# Patient Record
Sex: Female | Born: 1970 | Race: White | Hispanic: No | Marital: Married | Smoking: Never smoker
Health system: Southern US, Community
[De-identification: ages and names within clinical notes are randomized; demographics above are authoritative.]

## PROBLEM LIST (undated history)

## (undated) HISTORY — PX: AUGMENTATION MAMMAPLASTY: SUR837

---

## 2007-04-25 ENCOUNTER — Inpatient Hospital Stay (HOSPITAL_COMMUNITY): Admission: RE | Admit: 2007-04-25 | Discharge: 2007-04-26 | Payer: Self-pay | Admitting: Obstetrics and Gynecology

## 2007-08-31 ENCOUNTER — Ambulatory Visit (HOSPITAL_COMMUNITY): Admission: RE | Admit: 2007-08-31 | Discharge: 2007-08-31 | Payer: Self-pay | Admitting: Obstetrics and Gynecology

## 2008-08-11 ENCOUNTER — Encounter: Admission: RE | Admit: 2008-08-11 | Discharge: 2008-08-11 | Payer: Self-pay | Admitting: Obstetrics and Gynecology

## 2011-03-01 NOTE — H&P (Signed)
Julie Ho, Julie Ho NO.:  [id number]   MEDICAL RECORD NO.:  [id number]          PATIENT TYPE:  AMB   LOCATION:  SDC                           FACILITY:  [facility]   PHYSICIAN:  Huel Cote, M.D. DATE OF BIRTH:  Jul 20, 1971   DATE OF ADMISSION:  08/31/2007  DATE OF DISCHARGE:                              HISTORY & PHYSICAL   PRIORITY PREADMISSION HISTORY AND PHYSICAL   DATE OF SURGERY:  August 31, 2007.   The patient is a G5, P3-0-2-3, who is coming in for a scheduled elective  sterilization procedure with bilateral tubal fulguration.  The patient  desires permanent sterility and has recently had her third baby  approximately three months ago.  Her prenatal care has been uneventful  with her pregnancies, and she is a healthy female.   PAST MEDICAL HISTORY:  1. Allergic asthma.  2. History of ulcer disease.   PAST SURGICAL HISTORY:  In 2005, she had breast augmentation surgery.   PAST OBSTETRICAL HISTORY:  1. Three vaginal deliveries.  2. Two spontaneous miscarriages.   PAST GYNECOLOGIC HISTORY:  She has had no abnormal Pap smears  gynecologically.   She is not allergic to any medicines.   Currently, she is on Depo-Provera for birth control.   We reviewed all options of birth control and definitive permanent  sterilization.  The patient desires to proceed with a permanent  sterilization procedure.  The risks and benefits of tubal fulguration  were discussed with the patient in detail.  We discussed laparoscopy  specifically and the risks of damage to adjacent bowel and bladder with  the need for a larger incision should this occur.  We also discussed a  risk of pregnancy occurrence after tubal ligation of 1 in 100, and the  risk of ectopic pregnancy should this occur.  The patient understands  the risks of bleeding and infection and possible damage to bowel and  bladder as well as the other risks addressed and desires to proceed with  the surgery as  stated.   PHYSICAL EXAMINATION:  VITAL SIGNS:  Her weight is 135, blood pressure  100/78.  CARDIAC EXAM:  Regular rate and rhythm.  ABDOMEN:  Soft and nontender.  LUNGS:  Clear.  PELVIC EXAM:  She has normal external genitalia noted, cervix has no  lesions, uterus is small and anteverted, adnexa have no masses.   Given all options, the patient is electing to proceed with a bilateral  tubal fulguration and will be at the [facility] facility  approximately two hours prior to procedure.      Huel Cote, M.D.  Electronically Signed     KR/MEDQ  D:  08/30/2007  T:  08/30/2007  Job:  [id number]

## 2011-03-01 NOTE — Discharge Summary (Signed)
Julie Ho, URAM NO.:  [id number]   MEDICAL RECORD NO.:  [id number]          PATIENT TYPE:  INP   LOCATION:  9126                          FACILITY:  [facility]   PHYSICIAN:  Huel Cote, M.D. DATE OF BIRTH:  [birth year]   DATE OF ADMISSION:  04/25/2007  DATE OF DISCHARGE:  04/26/2007                               DISCHARGE SUMMARY   DISCHARGE DIAGNOSES:  1. Term pregnancy at 39+ weeks, delivered.  2. Status post normal spontaneous vaginal delivery.   DISCHARGE MEDICATIONS:  1. Motrin 600 mg p.o. every 6 hours.  2. Percocet 1-2 tablets p.o. every 6 hours p.r.n.   DISCHARGE FOLLOW UP:  The patient is to followup in the office in 6  weeks for her postpartum exam.   HOSPITAL COURSE:  The patient is a 40 year old G5, P43 who is admitted  at 39-5/[redacted] weeks gestation for induction of labor given term status and a  favorable cervix.  Prenatal care had been uneventful except for advanced  maternal age with a normal first trimester screen.   Prenatal labs are as follows:  O positive, antibody negative, RPR  nonreactive, rubella immune, hepatitis B surface antigen negative, GC  and Chlamydia negative, group B Strep negative, 1 hour glucose is 77.   PAST OBSTETRICAL HISTORY:  In 1996 she had a 6 pound 8 ounce infant by  forceps.  In 2002 spontaneous miscarriage.  In 2003 spontaneous  miscarriage.  In 2004 she had vaginal delivery of a 7 pound infant.  She  had no abnormal Pap smears.   MEDICAL HISTORY:  Significant only for allergic asthma and a history of  ulcer disease.   PAST SURGICAL HISTORY:  In 2005 she had some breast implants.   ALLERGIES:  None.   MEDICATIONS:  None.   On admission she was afebrile with stable vital signs.  Fetal heart rate  was reactive.  Cervix was 52 and a -2 station.  She had rupture of  membranes performed with clear fluid noted and received an epidural.  Shortly thereafter when she became uncomfortable, she pushed very well  and delivered a normal spontaneous vaginal delivery of a vigorous female  infant over an intact perineum.  Apgars were 8 and 9.  Weight was 7  pounds 4 ounces.  The placenta was delivered spontaneously.  At first  she was going to proceed with a postpartum tubal ligation, however  secondarily  declined and decided to do alternate means of birth control, probably a  vasectomy in her husband.  On postpartum day #1 she was doing quite well  and requested an early discharge.  She was discharged to home with  followup in the office in 6 weeks.      Huel Cote, M.D.  Electronically Signed     KR/MEDQ  D:  06/22/2007  T:  06/22/2007  Job:  [id number]

## 2011-03-01 NOTE — Op Note (Signed)
Julie Ho, PLETZ NO.:  [id number]   MEDICAL RECORD NO.:  [id number]          PATIENT TYPE:  AMB   LOCATION:  SDC                           FACILITY:  [facility]   PHYSICIAN:  Huel Cote, M.D. DATE OF BIRTH:  Oct 15, 1971   DATE OF PROCEDURE:  08/31/2007  DATE OF DISCHARGE:                               OPERATIVE REPORT   PREOPERATIVE DIAGNOSIS:  Desires sterility.   POSTOPERATIVE DIAGNOSIS:  Desires sterility.   PROCEDURE:  Laparoscopic bilateral tubal fulguration.   SURGEON:  Huel Cote, MD   ASSISTANT:  None.   ANESTHESIA:  General.   FINDINGS:  There is normal pelvic and abdominal anatomy noted.   SPECIMEN:  None.   ESTIMATED BLOOD LOSS:  50 mL.   URINE OUTPUT:  50 mL, straight catheterization prior to procedure of  clear urine.   INTRAVENOUS FLUIDS:  2100 mL LR.   PROCEDURE:  The patient was taken to the operating room, where general  anesthesia was obtained without difficulty.  She was then prepped and  draped in the normal sterile fashion in the dorsal lithotomy position.  A speculum was placed in the patient's vagina and the cervix identified  and a Hulka tenaculum placed within it for uterine manipulation.  The  bladder was emptied and attention was then turned to the patient's  abdomen.  A small infraumbilical incision was then made after injection  with 0.25% Marcaine and the Veress needle introduced into the peritoneal  cavity; this was confirmed with both aspiration and injection with  normal saline.  The 10/11 trocar was then placed into the incision and  entered into the cavity without difficulty.  The operating scope was  then introduced and through the trocar and the pelvis and abdomen  inspected.  The uterus and ovaries appeared completely within normal  limits.  The appendix was normal on the right and the liver edge and  gallbladder appeared grossly normal.  At this point, the tubes were  clearly visible traced out to  their fimbriated ends without difficulty.  There were then grasped approximately 3 cm from the cornu and a 2- to 3-  cm segment of tube was fulgurated with the Kleppinger cautery with good  blanching noted after several burns; this was performed bilaterally.  At  the conclusion of the procedure, there was a 2- to 3-cm segment of tube  which was completely blanched and had been burned several times at each  location.  There was no active bleeding noted and the pelvis and abdomen  appeared normal; therefore, the camera was removed from the trocar, the  pneumoperitoneum reduced through the trocar and the trocar was removed  without difficulty.  The umbilical incision was then closed with 1 deep  layer of zero Vicryl in an interrupted suture and then an additional  subcuticular stitch of 4-0 Vicryl to close the skin.  Sponge, lap and  needle  counts were correct x2 and the patient was taken to the recovery room in  stable condition after the Hulka tenaculum was removed.   OPERATIVE REPORT:  , we [place] thank  you      Huel Cote, M.D.  Electronically Signed     KR/MEDQ  D:  08/31/2007  T:  09/01/2007  Job:  [id number]

## 2011-07-26 LAB — CBC
RBC: 4.3
WBC: 5

## 2011-08-02 LAB — CBC
MCHC: 34.1
MCHC: 34.3
Platelets: 223
RDW: 12.9
RDW: 13.1
WBC: 11.1 — ABNORMAL HIGH
WBC: 9.4

## 2014-04-07 ENCOUNTER — Other Ambulatory Visit: Payer: Self-pay | Admitting: Obstetrics and Gynecology

## 2014-04-07 DIAGNOSIS — R928 Other abnormal and inconclusive findings on diagnostic imaging of breast: Secondary | ICD-10-CM

## 2014-04-14 ENCOUNTER — Ambulatory Visit
Admission: RE | Admit: 2014-04-14 | Discharge: 2014-04-14 | Disposition: A | Discharge status: Home / Self Care | Payer: BC Managed Care – PPO | Source: Ambulatory Visit | Attending: Obstetrics and Gynecology | Admitting: Obstetrics and Gynecology

## 2014-04-14 DIAGNOSIS — R928 Other abnormal and inconclusive findings on diagnostic imaging of breast: Secondary | ICD-10-CM

## 2017-02-12 ENCOUNTER — Ambulatory Visit (HOSPITAL_COMMUNITY)
Admission: EM | Admit: 2017-02-12 | Discharge: 2017-02-12 | Disposition: A | Discharge status: Home / Self Care | Payer: BC Managed Care – PPO | Attending: Internal Medicine | Admitting: Internal Medicine

## 2017-02-12 ENCOUNTER — Encounter (HOSPITAL_COMMUNITY): Payer: Self-pay | Admitting: *Deleted

## 2017-02-12 DIAGNOSIS — J019 Acute sinusitis, unspecified: Secondary | ICD-10-CM

## 2017-02-12 MED ORDER — CETIRIZINE-PSEUDOEPHEDRINE ER 5-120 MG PO TB12
1.0000 | ORAL_TABLET | Freq: Every day | ORAL | 0 refills | Status: DC
Start: 2017-02-12 — End: 2017-03-01

## 2017-02-12 MED ORDER — FLUTICASONE PROPIONATE 50 MCG/ACT NA SUSP: 2.0000 | Freq: Every day | NASAL | 2 refills | Status: DC

## 2017-02-12 MED ORDER — AMOXICILLIN-POT CLAVULANATE 875-125 MG PO TABS: 1.0000 | ORAL_TABLET | Freq: Two times a day (BID) | ORAL | 0 refills | Status: DC

## 2017-02-12 NOTE — ED Triage Notes (Signed)
Patient reports nasal congestion/drainage, headache, fever, and loss of voice.

## 2017-02-12 NOTE — Discharge Instructions (Signed)
A neti pot is a container designed to rinse debris or mucus from your nasal cavity. You might use a neti pot to treat symptoms of nasal allergies, sinus problems or colds. °If you choose to make your own saltwater solution, it's important to use bottled water that has been distilled or sterilized. Tap water is acceptable if it's been boiled for several minutes and then left to cool until it is lukewarm. °To use the neti pot, tilt your head sideways over the sink and place the spout of the neti pot in the upper nostril. Breathing through your open mouth, gently pour the saltwater solution into your upper nostril so that the liquid drains through the lower nostril. Repeat on the other side. °Be sure to rinse the irrigation device after each use with similarly distilled, sterile, previously boiled and cooled, or filtered water and leave open to air dry. °Neti pots are often available in pharmacies and health food stores, as well online.  ° °

## 2017-02-12 NOTE — ED Provider Notes (Signed)
CSN: [id number]     Arrival date & time 02/12/17  1205 History   None    Chief Complaint  Patient presents with  . Headache  . Nasal Congestion  . Fever   (Consider location/radiation/quality/duration/timing/severity/associated sxs/prior Treatment)  HPI   Patient is a 46 year old female who reports a biannual history of sinus infections and nasal congestion.  Patient's last antibiotic was less than 6 months ago. Patient states she was fine until this past Friday when she started with a scratchy sore throat pounding headache and a fever. Patient was a productive cough with yellow sputum.   History reviewed. No pertinent past medical history. History reviewed. No pertinent surgical history. History reviewed. No pertinent family history. Social History  Substance Use Topics  . Smoking status: Never Smoker  . Smokeless tobacco: Never Used  . Alcohol use No   OB History    No data available     Review of Systems  Constitutional: Positive for fatigue and fever.  HENT: Negative.   Eyes: Positive for itching. Negative for visual disturbance.  Respiratory: Negative.  Negative for cough and shortness of breath.   Cardiovascular: Negative.  Negative for chest pain and leg swelling.  Gastrointestinal: Negative.   Endocrine: Negative.   Genitourinary: Negative.   Musculoskeletal: Negative.  Negative for gait problem and neck stiffness.  Skin: Negative.   Allergic/Immunologic: Negative.   Neurological: Negative.  Negative for dizziness and headaches.  Hematological: Negative.   Psychiatric/Behavioral: Negative.     Allergies  Aspirin  Home Medications   Prior to Admission medications   Medication Sig Start Date End Date Taking? Authorizing Provider  amoxicillin-clavulanate (AUGMENTIN) 875-125 MG tablet Take 1 tablet by mouth every 12 (twelve) hours. 02/12/17   Servando Salina, NP  cetirizine-pseudoephedrine (ZYRTEC-D) 5-120 MG tablet Take 1 tablet by mouth daily. 02/12/17    Servando Salina, NP  fluticasone (FLONASE) 50 MCG/ACT nasal spray Place 2 sprays into both nostrils daily. 02/12/17   Servando Salina, NP   Meds Ordered and Administered this Visit  Medications - No data to display  BP 131/75   Pulse 70   Temp 98.3 F (36.8 C) (Oral)   Resp 17   SpO2 100%  No data found.   Physical Exam  Constitutional: She is oriented to person, place, and time. She appears well-developed and well-nourished. No distress.  HENT:  Head: Normocephalic and atraumatic.  Right Ear: External ear normal.  Left Ear: External ear normal.  Mouth/Throat: Oropharyngeal exudate present.  Bilateral nares patent but swollen and boggy in appearance.  Eyes: Pupils are equal, round, and reactive to light. Right eye exhibits no discharge. Left eye exhibits no discharge. No scleral icterus.  Neck: Normal range of motion. Neck supple. No tracheal deviation present.  Negative nuchal rigidity. Patient has mild left anterior cervical lymphadenopathy present.  Cardiovascular: Normal rate, regular rhythm, normal heart sounds and intact distal pulses.  Exam reveals no gallop and no friction rub.   No murmur heard. Pulmonary/Chest: Effort normal and breath sounds normal. No stridor. No respiratory distress. She has no wheezes. She has no rales. She exhibits no tenderness.  Lymphadenopathy:    She has cervical adenopathy.  Neurological: She is alert and oriented to person, place, and time.  Skin: Skin is warm and dry. No rash noted. She is not diaphoretic. No erythema. No pallor.  Nursing note and vitals reviewed.   Urgent Care Course     Procedures (including critical care time)  Labs Review  Labs Reviewed - No data to display  Imaging Review No results found.    MDM   1. Acute sinusitis, recurrence not specified, unspecified location    Meds ordered this encounter  Medications  . amoxicillin-clavulanate (AUGMENTIN) 875-125 MG tablet    Sig: Take 1 tablet by mouth every  12 (twelve) hours.    Dispense:  14 tablet    Refill:  0  . fluticasone (FLONASE) 50 MCG/ACT nasal spray    Sig: Place 2 sprays into both nostrils daily.    Dispense:  16 g    Refill:  2  . cetirizine-pseudoephedrine (ZYRTEC-D) 5-120 MG tablet    Sig: Take 1 tablet by mouth daily.    Dispense:  15 tablet    Refill:  0   The usual and customary discharge instructions and warnings were given.  The patient verbalizes understanding and agrees to plan of care.        Servando Salina, NP 02/12/17 [phone]

## 2017-03-01 ENCOUNTER — Encounter (HOSPITAL_COMMUNITY): Payer: Self-pay | Admitting: Family Medicine

## 2017-03-01 ENCOUNTER — Ambulatory Visit (HOSPITAL_COMMUNITY)
Admission: EM | Admit: 2017-03-01 | Discharge: 2017-03-01 | Disposition: A | Discharge status: Home / Self Care | Payer: BC Managed Care – PPO | Attending: Family Medicine | Admitting: Family Medicine

## 2017-03-01 DIAGNOSIS — J309 Allergic rhinitis, unspecified: Secondary | ICD-10-CM | POA: Diagnosis not present

## 2017-03-01 DIAGNOSIS — R0982 Postnasal drip: Secondary | ICD-10-CM | POA: Diagnosis not present

## 2017-03-01 DIAGNOSIS — J9801 Acute bronchospasm: Secondary | ICD-10-CM | POA: Diagnosis not present

## 2017-03-01 MED ORDER — PREDNISONE 20 MG PO TABS: ORAL_TABLET | ORAL | 0 refills | Status: DC

## 2017-03-01 MED ORDER — ALBUTEROL SULFATE HFA 108 (90 BASE) MCG/ACT IN AERS: 2.0000 | INHALATION_SPRAY | RESPIRATORY_TRACT | 0 refills | Status: AC | PRN

## 2017-03-01 NOTE — ED Provider Notes (Signed)
CSN: [id number]     Arrival date & time 03/01/17  1523 History   First MD Initiated Contact with Patient 03/01/17 1647     Chief Complaint  Patient presents with  . Cough  . Shortness of Breath   (Consider location/radiation/quality/duration/timing/severity/associated sxs/prior Treatment) 46 year old female complaining of sinus congestion. She states since her last visit here 2 weeks ago and being treated with Augmentin her fever is gone and she still has congestion and at that time started with a cough and shortness of breath. She used to have asthma and used an inhaler at that time but not recently. She also complains of PND. She is not taking any medicines for congestion but she is using fluticasone nasal spray.      History reviewed. No pertinent past medical history. History reviewed. No pertinent surgical history. History reviewed. No pertinent family history. Social History  Substance Use Topics  . Smoking status: Never Smoker  . Smokeless tobacco: Never Used  . Alcohol use No   OB History    No data available     Review of Systems  Constitutional: Negative for activity change, appetite change, chills, fatigue and fever.  HENT: Positive for congestion, postnasal drip and sinus pressure. Negative for facial swelling and rhinorrhea.   Eyes: Negative.   Respiratory: Positive for cough, shortness of breath and wheezing.   Cardiovascular: Negative.   Gastrointestinal: Negative.   Musculoskeletal: Negative for neck pain and neck stiffness.  Skin: Negative for pallor and rash.  Neurological: Negative.   All other systems reviewed and are negative.   Allergies  Aspirin  Home Medications   Prior to Admission medications   Medication Sig Start Date End Date Taking? Authorizing Provider  albuterol (PROVENTIL HFA;VENTOLIN HFA) 108 (90 Base) MCG/ACT inhaler Inhale 2 puffs into the lungs every 4 (four) hours as needed for wheezing or shortness of breath. 03/01/17   Hayden RasmussenMabe,  Sally-Ann Cutbirth, NP  fluticasone (FLONASE) 50 MCG/ACT nasal spray Place 2 sprays into both nostrils daily. 02/12/17   Servando Salinaossi, Catherine H, NP  predniSONE (DELTASONE) 20 MG tablet 3 Tabs PO Days 1-3, then 2 tabs PO Days 4-6, then 1 tab PO Day 7-9, then Half Tab PO Day 10-12. Take with food. 03/01/17   Hayden RasmussenMabe, Raigan Baria, NP   Meds Ordered and Administered this Visit  Medications - No data to display  BP 118/81   Pulse 75   Temp 98 F (36.7 C) (Oral)   Resp 18   SpO2 100%  No data found.   Physical Exam  Constitutional: She is oriented to person, place, and time. She appears well-developed and well-nourished. No distress.  HENT:  Mouth/Throat: No oropharyngeal exudate.  Bilateral TMs with mild retraction.. No effusion or erythema. Oropharynx pink with minor injection and clear PND.  Eyes: EOM are normal.  Neck: Normal range of motion. Neck supple.  Cardiovascular: Normal rate, regular rhythm, normal heart sounds and intact distal pulses.   Pulmonary/Chest: Effort normal. No respiratory distress. She has wheezes.  Musculoskeletal: Normal range of motion. She exhibits no edema.  Lymphadenopathy:    She has no cervical adenopathy.  Neurological: She is alert and oriented to person, place, and time.  Skin: Skin is warm and dry. No rash noted.  Psychiatric: She has a normal mood and affect.  Nursing note and vitals reviewed.   Urgent Care Course     Procedures (including critical care time)  Labs Review Labs Reviewed - No data to display  Imaging Review No results found.  Visual Acuity Review  Right Eye Distance:   Left Eye Distance:   Bilateral Distance:    Right Eye Near:   Left Eye Near:    Bilateral Near:         MDM   1. Allergic sinusitis   2. PND (post-nasal drip)   3. Cough due to bronchospasm    Sudafed PE 10 mg every 4 to 6 hours as needed for congestion Allegra or Zyrtec daily as needed for drainage and runny nose. For stronger antihistamine may take  Chlor-Trimeton 2 to 4 mg every 4 to 6 hours, may cause drowsiness. Saline nasal spray used frequently. Ibuprofen 600 mg every 6 hours as needed for pain, discomfort or fever. Drink plenty of fluids and stay well-hydrated. Flonase or Rhinocort nasal spray daily Meds ordered this encounter  Medications  . albuterol (PROVENTIL HFA;VENTOLIN HFA) 108 (90 Base) MCG/ACT inhaler    Sig: Inhale 2 puffs into the lungs every 4 (four) hours as needed for wheezing or shortness of breath.    Dispense:  1 Inhaler    Refill:  0    Order Specific Question:   Supervising Provider    Answer:   Elvina Sidle [[id number]]  . predniSONE (DELTASONE) 20 MG tablet    Sig: 3 Tabs PO Days 1-3, then 2 tabs PO Days 4-6, then 1 tab PO Day 7-9, then Half Tab PO Day 10-12. Take with food.    Dispense:  20 tablet    Refill:  0    Order Specific Question:   Supervising Provider    Answer:   Elvina Sidle [[id number]]       Hayden Rasmussen, NP 03/01/17 1711

## 2017-03-01 NOTE — ED Triage Notes (Signed)
Pt here for cough, SOB. sts that she has been sick for a few weeks and was seen here last week. sts she has finished taking all her meds and not better.

## 2017-03-01 NOTE — Discharge Instructions (Signed)
Sudafed PE 10 mg every 4 to 6 hours as needed for congestion °Allegra or Zyrtec daily as needed for drainage and runny nose. °For stronger antihistamine may take Chlor-Trimeton 2 to 4 mg every 4 to 6 hours, may cause drowsiness. °Saline nasal spray used frequently. °Ibuprofen 600 mg every 6 hours as needed for pain, discomfort or fever. °Drink plenty of fluids and stay well-hydrated. °Flonase or Rhinocort nasal spray daily °

## 2018-03-08 ENCOUNTER — Other Ambulatory Visit: Payer: Self-pay

## 2018-03-08 ENCOUNTER — Ambulatory Visit (HOSPITAL_COMMUNITY)
Admission: EM | Admit: 2018-03-08 | Discharge: 2018-03-08 | Disposition: A | Discharge status: Home / Self Care | Payer: BC Managed Care – PPO | Attending: Family Medicine | Admitting: Family Medicine

## 2018-03-08 ENCOUNTER — Encounter (HOSPITAL_COMMUNITY): Payer: Self-pay | Admitting: Emergency Medicine

## 2018-03-08 DIAGNOSIS — J069 Acute upper respiratory infection, unspecified: Secondary | ICD-10-CM | POA: Diagnosis not present

## 2018-03-08 DIAGNOSIS — B9789 Other viral agents as the cause of diseases classified elsewhere: Secondary | ICD-10-CM | POA: Diagnosis not present

## 2018-03-08 MED ORDER — CETIRIZINE HCL 10 MG PO CAPS: 10.0000 mg | ORAL_CAPSULE | Freq: Every day | ORAL | 0 refills | Status: AC

## 2018-03-08 MED ORDER — BENZONATATE 200 MG PO CAPS: 200.0000 mg | ORAL_CAPSULE | Freq: Three times a day (TID) | ORAL | 0 refills | Status: AC | PRN

## 2018-03-08 MED ORDER — IBUPROFEN 600 MG PO TABS: 600.0000 mg | ORAL_TABLET | Freq: Four times a day (QID) | ORAL | 0 refills | Status: AC | PRN

## 2018-03-08 MED ORDER — FLUTICASONE PROPIONATE 50 MCG/ACT NA SUSP: 2.0000 | Freq: Every day | NASAL | 2 refills | Status: DC

## 2018-03-08 NOTE — Discharge Instructions (Signed)
Begin flonase, continue zyrtec; may also try afrin for 3 days  Tessalon for cough  Use anti-inflammatories for headache/fever. You may take up to 800 mg Ibuprofen every 8 hours with food. You may supplement Ibuprofen with Tylenol [phone] mg every 8 hours.

## 2018-03-08 NOTE — ED Provider Notes (Signed)
[facility]    CSN: [id number] Arrival date & time: 03/08/18  1013     History   Chief Complaint Chief Complaint  Patient presents with  . URI    HPI Julie Ho is a 47 y.o. female no contributing past medical history, Patient is presenting with URI symptoms- congestion, cough, mild sore throat.  So endorsing headache and hoarseness.  Patient's main complaints are congestion. Symptoms have been going on for 2 days. Patient has tried Tylenol, taking Zyrtec for 3 days, with minimal relief. Denies fever, nausea, vomiting, diarrhea. Denies shortness of breath and chest pain.  Denies history of asthma and smoking.   HPI  History reviewed. No pertinent past medical history.  There are no active problems to display for this patient.   History reviewed. No pertinent surgical history.  OB History   None      Home Medications    Prior to Admission medications   Medication Sig Start Date End Date Taking? Authorizing Provider  acetaminophen (TYLENOL) 325 MG tablet Take 650 mg by mouth every 6 (six) hours as needed.   Yes [provider]  albuterol (PROVENTIL HFA;VENTOLIN HFA) 108 (90 Base) MCG/ACT inhaler Inhale 2 puffs into the lungs every 4 (four) hours as needed for wheezing or shortness of breath. 03/01/17   Hayden Rasmussen, NP  benzonatate (TESSALON) 200 MG capsule Take 1 capsule (200 mg total) by mouth 3 (three) times daily as needed for up to 7 days for cough. 03/08/18 03/15/18  Kloi Brodman C, PA-C  Cetirizine HCl 10 MG CAPS Take 1 capsule (10 mg total) by mouth daily for 15 days. 03/08/18 03/23/18  Payden Docter C, PA-C  fluticasone (FLONASE) 50 MCG/ACT nasal spray Place 2 sprays into both nostrils daily. 03/08/18   Chris Narasimhan C, PA-C  ibuprofen (ADVIL,MOTRIN) 600 MG tablet Take 1 tablet (600 mg total) by mouth every 6 (six) hours as needed. 03/08/18   Selassie Spatafore, Junius Creamer, PA-C    Family History Family History  Problem Relation Age of Onset  .  Hypertension Mother   . Hypertension Father     Social History Social History   Tobacco Use  . Smoking status: Never Smoker  . Smokeless tobacco: Never Used  Substance Use Topics  . Alcohol use: No  . Drug use: Never     Allergies   Aspirin   Review of Systems Review of Systems  Constitutional: Negative for chills, fatigue and fever.  HENT: Positive for congestion, rhinorrhea, sinus pressure, sore throat and voice change. Negative for ear pain and trouble swallowing.   Respiratory: Positive for cough. Negative for chest tightness and shortness of breath.   Cardiovascular: Negative for chest pain.  Gastrointestinal: Negative for abdominal pain, nausea and vomiting.  Musculoskeletal: Negative for myalgias.  Skin: Negative for rash.  Neurological: Negative for dizziness, light-headedness and headaches.     Physical Exam Triage Vital Signs ED Triage Vitals  Enc Vitals Group     BP 03/08/18 1047 122/70     Pulse Rate 03/08/18 1047 68     Resp 03/08/18 1047 18     Temp 03/08/18 1047 98 F (36.7 C)     Temp Source 03/08/18 1047 Oral     SpO2 03/08/18 1047 100 %     Weight --      Height --      Head Circumference --      Peak Flow --      Pain Score 03/08/18 1044 2  Pain Loc --      Pain Edu? --      Excl. in GC? --    No data found.  Updated Vital Signs BP 122/70 (BP Location: Left Arm)   Pulse 68   Temp 98 F (36.7 C) (Oral)   Resp 18   LMP 03/01/2018   SpO2 100%   Visual Acuity Right Eye Distance:   Left Eye Distance:   Bilateral Distance:    Right Eye Near:   Left Eye Near:    Bilateral Near:     Physical Exam  Constitutional: She appears well-developed and well-nourished. No distress.  HENT:  Head: Normocephalic and atraumatic.  Bilateral ears without tenderness to palpation of external auricle, tragus and mastoid, EAC's without erythema or swelling, TM's with good bony landmarks and cone of light. Non erythematous.  Oral mucosa pink and  moist, no tonsillar enlargement or exudate. Posterior pharynx patent and erythematous, no uvula deviation or swelling. Normal phonation.   Eyes: Conjunctivae are normal.  Neck: Neck supple.  Cardiovascular: Normal rate and regular rhythm.  No murmur heard. Pulmonary/Chest: Effort normal and breath sounds normal. No respiratory distress.  Breathing comfortably at rest, CTABL, no wheezing, rales or other adventitious sounds auscultated  Abdominal: Soft. There is no tenderness.  Musculoskeletal: She exhibits no edema.  Neurological: She is alert.  Skin: Skin is warm and dry.  Psychiatric: She has a normal mood and affect.  Nursing note and vitals reviewed.    UC Treatments / Results  Labs (all labs ordered are listed, but only abnormal results are displayed) Labs Reviewed - No data to display  EKG None  Radiology No results found.  Procedures Procedures (including critical care time)  Medications Ordered in UC Medications - No data to display  Initial Impression / Assessment and Plan / UC Course  I have reviewed the triage vital signs and the nursing notes.  Pertinent labs & imaging results that were available during my care of the patient were reviewed by me and considered in my medical decision making (see chart for details).     Patient with URI symptoms, likely viral etiology.  Vital signs stable.  Will recommend symptomatic management; recommendations below.  Also advised honey/lemon in hot tea for hoarseness.  Expect gradual resolution over the next week.Discussed strict return precautions. Patient verbalized understanding and is agreeable with plan.  Final Clinical Impressions(s) / UC Diagnoses   Final diagnoses:  Viral URI with cough     Discharge Instructions     Begin flonase, continue zyrtec; may also try afrin for 3 days  Tessalon for cough  Use anti-inflammatories for headache/fever. You may take up to 800 mg Ibuprofen every 8 hours with food. You may  supplement Ibuprofen with Tylenol [phone] mg every 8 hours.     ED Prescriptions    Medication Sig Dispense Auth. Provider   fluticasone (FLONASE) 50 MCG/ACT nasal spray Place 2 sprays into both nostrils daily. 16 g Naelle Diegel C, PA-C   Cetirizine HCl 10 MG CAPS Take 1 capsule (10 mg total) by mouth daily for 15 days. 15 capsule Dearia Wilmouth C, PA-C   benzonatate (TESSALON) 200 MG capsule Take 1 capsule (200 mg total) by mouth 3 (three) times daily as needed for up to 7 days for cough. 28 capsule Essica Kiker C, PA-C   ibuprofen (ADVIL,MOTRIN) 600 MG tablet Take 1 tablet (600 mg total) by mouth every 6 (six) hours as needed. 30 tablet Albaro Deviney, [place] C, PA-C  Controlled Substance Prescriptions [place] Controlled Substance Registry consulted? Not Applicable   Lew Dawes, [place] 03/08/18 1131

## 2018-03-08 NOTE — ED Triage Notes (Signed)
Onset 2 days ago of not feeling well.  Having headaches and cough, sinus drainage.  Voice is intermittently audible.

## 2018-08-07 ENCOUNTER — Other Ambulatory Visit: Payer: Self-pay | Admitting: Obstetrics and Gynecology

## 2018-08-07 DIAGNOSIS — R928 Other abnormal and inconclusive findings on diagnostic imaging of breast: Secondary | ICD-10-CM

## 2018-08-13 ENCOUNTER — Ambulatory Visit
Admission: RE | Admit: 2018-08-13 | Discharge: 2018-08-13 | Disposition: A | Discharge status: Home / Self Care | Payer: BC Managed Care – PPO | Source: Ambulatory Visit | Attending: Obstetrics and Gynecology | Admitting: Obstetrics and Gynecology

## 2018-08-13 DIAGNOSIS — R928 Other abnormal and inconclusive findings on diagnostic imaging of breast: Secondary | ICD-10-CM

## 2018-12-23 ENCOUNTER — Ambulatory Visit (HOSPITAL_COMMUNITY)
Admission: EM | Admit: 2018-12-23 | Discharge: 2018-12-23 | Disposition: A | Discharge status: Home / Self Care | Payer: BC Managed Care – PPO | Attending: Family Medicine | Admitting: Family Medicine

## 2018-12-23 ENCOUNTER — Encounter (HOSPITAL_COMMUNITY): Payer: Self-pay | Admitting: Emergency Medicine

## 2018-12-23 DIAGNOSIS — J019 Acute sinusitis, unspecified: Secondary | ICD-10-CM

## 2018-12-23 MED ORDER — AMOXICILLIN-POT CLAVULANATE 875-125 MG PO TABS: 1.0000 | ORAL_TABLET | Freq: Two times a day (BID) | ORAL | 0 refills | Status: AC

## 2018-12-23 MED ORDER — FLUTICASONE PROPIONATE 50 MCG/ACT NA SUSP: 1.0000 | Freq: Every day | NASAL | 0 refills | Status: AC

## 2018-12-23 NOTE — ED Triage Notes (Signed)
Pt here with URI sx with cough and nasal congestion

## 2018-12-23 NOTE — Discharge Instructions (Signed)
Please begin taking Augmentin twice daily for the next week Please continue to take daily cetirizine/Zyrtec or Claritin Use Flonase nasal spray 1 to 2 sprays in each nostril daily May supplement with Mucinex if congestion still not managed For cough May use over-the-counter Robitussin, Delsym Rest, drink plenty of fluids  For sore throat try using a honey-based tea. Use 3 teaspoons of honey with juice squeezed from half lemon. Place shaved pieces of ginger into 1/2-1 cup of water and warm over stove top. Then mix the ingredients and repeat every 4 hours as needed.  Follow-up if not resolving or worsening

## 2018-12-24 NOTE — ED Provider Notes (Signed)
[facility]    CSN: [id number] Arrival date & time: 12/23/18  1039     History   Chief Complaint Chief Complaint  Patient presents with  . URI    HPI Julie Ho is a 48 y.o. female no contributing past medical history,Patient is presenting with URI symptoms- congestion, cough, sore throat. Patient's main complaints are congestion and sinus pressure. Symptoms have been going on for off and on for the past couple of weeks, recently worsened over the past 3 days. Patient has tried sinus rinse, Flonase, Claritin, with minimal relief. Denies fever, nausea, vomiting, diarrhea. Denies shortness of breath and chest pain.    HPI  History reviewed. No pertinent past medical history.  There are no active problems to display for this patient.   Past Surgical History:  Procedure Laterality Date  . AUGMENTATION MAMMAPLASTY      OB History   No obstetric history on file.      Home Medications    Prior to Admission medications   Medication Sig Start Date End Date Taking? Authorizing Provider  acetaminophen (TYLENOL) 325 MG tablet Take 650 mg by mouth every 6 (six) hours as needed.    [provider]  albuterol (PROVENTIL HFA;VENTOLIN HFA) 108 (90 Base) MCG/ACT inhaler Inhale 2 puffs into the lungs every 4 (four) hours as needed for wheezing or shortness of breath. 03/01/17   Hayden RasmussenMabe, David, NP  amoxicillin-clavulanate (AUGMENTIN) 875-125 MG tablet Take 1 tablet by mouth every 12 (twelve) hours. 12/23/18   Librada Castronovo C, PA-C  Cetirizine HCl 10 MG CAPS Take 1 capsule (10 mg total) by mouth daily for 15 days. 03/08/18 03/23/18  Delonta Yohannes C, PA-C  fluticasone (FLONASE) 50 MCG/ACT nasal spray Place 1-2 sprays into both nostrils daily for 7 days. 12/23/18 12/30/18  Christifer Chapdelaine C, PA-C  ibuprofen (ADVIL,MOTRIN) 600 MG tablet Take 1 tablet (600 mg total) by mouth every 6 (six) hours as needed. 03/08/18   Juddson Cobern, Junius CreamerHallie C, PA-C    Family History Family History    Problem Relation Age of Onset  . Hypertension Mother   . Hypertension Father     Social History Social History   Tobacco Use  . Smoking status: Never Smoker  . Smokeless tobacco: Never Used  Substance Use Topics  . Alcohol use: No  . Drug use: Never     Allergies   Aspirin   Review of Systems Review of Systems  Constitutional: Negative for activity change, appetite change, chills, fatigue and fever.  HENT: Positive for congestion, rhinorrhea, sinus pressure and sore throat. Negative for ear pain and trouble swallowing.   Eyes: Negative for discharge and redness.  Respiratory: Positive for cough. Negative for chest tightness and shortness of breath.   Cardiovascular: Negative for chest pain.  Gastrointestinal: Negative for abdominal pain, diarrhea, nausea and vomiting.  Musculoskeletal: Negative for myalgias.  Skin: Negative for rash.  Neurological: Negative for dizziness, light-headedness and headaches.     Physical Exam Triage Vital Signs ED Triage Vitals  Enc Vitals Group     BP 12/23/18 1110 127/77     Pulse Rate 12/23/18 1110 76     Resp 12/23/18 1110 18     Temp 12/23/18 1110 98.3 F (36.8 C)     Temp Source 12/23/18 1110 Oral     SpO2 12/23/18 1110 100 %     Weight --      Height --      Head Circumference --  Peak Flow --      Pain Score 12/23/18 1111 3     Pain Loc --      Pain Edu? --      Excl. in GC? --    No data found.  Updated Vital Signs BP 127/77 (BP Location: Right Arm)   Pulse 76   Temp 98.3 F (36.8 C) (Oral)   Resp 18   SpO2 100%   Visual Acuity Right Eye Distance:   Left Eye Distance:   Bilateral Distance:    Right Eye Near:   Left Eye Near:    Bilateral Near:     Physical Exam Vitals signs and nursing note reviewed.  Constitutional:      General: She is not in acute distress.    Appearance: She is well-developed.  HENT:     Head: Normocephalic and atraumatic.     Ears:     Comments: Bilateral ears without  tenderness to palpation of external auricle, tragus and mastoid, EAC's without erythema or swelling, TM's with good bony landmarks and cone of light. Non erythematous.    Nose:     Comments: Nasal mucosa slightly erythematous and slightly swollen turbinates, no rhinorrhea present    Mouth/Throat:     Comments: Oral mucosa pink and moist, no tonsillar enlargement or exudate. Posterior pharynx patent and nonerythematous, no uvula deviation or swelling. Normal phonation.  Eyes:     Conjunctiva/sclera: Conjunctivae normal.  Neck:     Musculoskeletal: Neck supple.  Cardiovascular:     Rate and Rhythm: Normal rate and regular rhythm.     Heart sounds: No murmur.  Pulmonary:     Effort: Pulmonary effort is normal. No respiratory distress.     Breath sounds: Normal breath sounds.     Comments: Breathing comfortably at rest, CTABL, no wheezing, rales or other adventitious sounds auscultated Abdominal:     Palpations: Abdomen is soft.     Tenderness: There is no abdominal tenderness.  Skin:    General: Skin is warm and dry.  Neurological:     Mental Status: She is alert.      UC Treatments / Results  Labs (all labs ordered are listed, but only abnormal results are displayed) Labs Reviewed - No data to display  EKG None  Radiology No results found.  Procedures Procedures (including critical care time)  Medications Ordered in UC Medications - No data to display  Initial Impression / Assessment and Plan / UC Course  I have reviewed the triage vital signs and the nursing notes.  Pertinent labs & imaging results that were available during my care of the patient were reviewed by me and considered in my medical decision making (see chart for details).     URI symptoms over the past couple weeks with recent worsening, vital signs stable, exam nonfocal.  Will treat for sinusitis given length of symptoms, will provide Augmentin, refilled Flonase, continue further symptomatic and  supportive care for congestion and cough.  Lungs clear at this time, do not suspect underlying pneumonia or bronchitis.Discussed strict return precautions. Patient verbalized understanding and is agreeable with plan.  Final Clinical Impressions(s) / UC Diagnoses   Final diagnoses:  Acute sinusitis with symptoms > 10 days     Discharge Instructions     Please begin taking Augmentin twice daily for the next week Please continue to take daily cetirizine/Zyrtec or Claritin Use Flonase nasal spray 1 to 2 sprays in each nostril daily May supplement with Mucinex if congestion  still not managed For cough May use over-the-counter Robitussin, Delsym Rest, drink plenty of fluids  For sore throat try using a honey-based tea. Use 3 teaspoons of honey with juice squeezed from half lemon. Place shaved pieces of ginger into 1/2-1 cup of water and warm over stove top. Then mix the ingredients and repeat every 4 hours as needed.  Follow-up if not resolving or worsening   ED Prescriptions    Medication Sig Dispense Auth. Provider   amoxicillin-clavulanate (AUGMENTIN) 875-125 MG tablet Take 1 tablet by mouth every 12 (twelve) hours. 14 tablet Ashly Goethe C, PA-C   fluticasone (FLONASE) 50 MCG/ACT nasal spray Place 1-2 sprays into both nostrils daily for 7 days. 1 g Lexus Barletta, [place] C, PA-C     Controlled Substance Prescriptions [place] Controlled Substance Registry consulted? Not Applicable   Lew Dawes, [place] 12/24/18 3762

## 2021-04-13 ENCOUNTER — Other Ambulatory Visit: Payer: Self-pay | Admitting: Obstetrics and Gynecology

## 2021-04-13 DIAGNOSIS — R928 Other abnormal and inconclusive findings on diagnostic imaging of breast: Secondary | ICD-10-CM

## 2021-05-04 ENCOUNTER — Ambulatory Visit
Admission: RE | Admit: 2021-05-04 | Discharge: 2021-05-04 | Disposition: A | Discharge status: Home / Self Care | Payer: BC Managed Care – PPO | Source: Ambulatory Visit | Attending: Obstetrics and Gynecology | Admitting: Obstetrics and Gynecology

## 2021-05-04 ENCOUNTER — Other Ambulatory Visit: Payer: Self-pay

## 2021-05-04 ENCOUNTER — Other Ambulatory Visit: Payer: Self-pay | Admitting: Obstetrics and Gynecology

## 2021-05-04 DIAGNOSIS — R928 Other abnormal and inconclusive findings on diagnostic imaging of breast: Secondary | ICD-10-CM

## 2022-09-30 ENCOUNTER — Emergency Department (HOSPITAL_BASED_OUTPATIENT_CLINIC_OR_DEPARTMENT_OTHER)
Admission: EM | Admit: 2022-09-30 | Discharge: 2022-09-30 | Disposition: A | Discharge status: Home / Self Care | Payer: BC Managed Care – PPO | Attending: Emergency Medicine | Admitting: Emergency Medicine

## 2022-09-30 ENCOUNTER — Emergency Department (HOSPITAL_BASED_OUTPATIENT_CLINIC_OR_DEPARTMENT_OTHER): Payer: BC Managed Care – PPO

## 2022-09-30 ENCOUNTER — Other Ambulatory Visit: Payer: Self-pay

## 2022-09-30 ENCOUNTER — Encounter (HOSPITAL_BASED_OUTPATIENT_CLINIC_OR_DEPARTMENT_OTHER): Payer: Self-pay | Admitting: Emergency Medicine

## 2022-09-30 DIAGNOSIS — R9431 Abnormal electrocardiogram [ECG] [EKG]: Secondary | ICD-10-CM | POA: Diagnosis not present

## 2022-09-30 DIAGNOSIS — J101 Influenza due to other identified influenza virus with other respiratory manifestations: Secondary | ICD-10-CM | POA: Diagnosis not present

## 2022-09-30 DIAGNOSIS — R509 Fever, unspecified: Secondary | ICD-10-CM | POA: Diagnosis present

## 2022-09-30 DIAGNOSIS — M545 Low back pain, unspecified: Secondary | ICD-10-CM | POA: Insufficient documentation

## 2022-09-30 DIAGNOSIS — J111 Influenza due to unidentified influenza virus with other respiratory manifestations: Secondary | ICD-10-CM

## 2022-09-30 DIAGNOSIS — R109 Unspecified abdominal pain: Secondary | ICD-10-CM | POA: Diagnosis not present

## 2022-09-30 DIAGNOSIS — Z20822 Contact with and (suspected) exposure to covid-19: Secondary | ICD-10-CM | POA: Insufficient documentation

## 2022-09-30 LAB — URINALYSIS, ROUTINE W REFLEX MICROSCOPIC
Bilirubin Urine: NEGATIVE
Glucose, UA: NEGATIVE mg/dL
Ketones, ur: 40 mg/dL — AB
Leukocytes,Ua: NEGATIVE
Nitrite: NEGATIVE
Protein, ur: 30 mg/dL — AB
Specific Gravity, Urine: 1.034 — ABNORMAL HIGH (ref 1.005–1.030)
pH: 5.5 (ref 5.0–8.0)

## 2022-09-30 LAB — TROPONIN I (HIGH SENSITIVITY)
Troponin I (High Sensitivity): 9 ng/L (ref <18)
Troponin I (High Sensitivity): 8 ng/L (ref <18)

## 2022-09-30 LAB — CBC WITH DIFFERENTIAL/PLATELET
Abs Immature Granulocytes: 0.03 10*3/uL (ref 0.00–0.07)
Basophils Absolute: 0 10*3/uL (ref 0.0–0.1)
Basophils Relative: 0 %
Eosinophils Absolute: 0 10*3/uL (ref 0.0–0.5)
Eosinophils Relative: 0 %
HCT: 41.3 % (ref 36.0–46.0)
Hemoglobin: 13.9 g/dL (ref 12.0–15.0)
Immature Granulocytes: 1 %
Lymphocytes Relative: 16 %
Lymphs Abs: 0.6 10*3/uL — ABNORMAL LOW (ref 0.7–4.0)
MCH: 30.8 pg (ref 26.0–34.0)
MCHC: 33.7 g/dL (ref 30.0–36.0)
MCV: 91.4 fL (ref 80.0–100.0)
Monocytes Absolute: 0.6 10*3/uL (ref 0.1–1.0)
Monocytes Relative: 18 %
Neutro Abs: 2.2 10*3/uL (ref 1.7–7.7)
Neutrophils Relative %: 65 %
Platelets: 174 10*3/uL (ref 150–400)
RBC: 4.52 MIL/uL (ref 3.87–5.11)
RDW: 13 % (ref 11.5–15.5)
WBC: 3.4 10*3/uL — ABNORMAL LOW (ref 4.0–10.5)
nRBC: 0 % (ref 0.0–0.2)

## 2022-09-30 LAB — BASIC METABOLIC PANEL
Anion gap: 13 (ref 5–15)
BUN: 16 mg/dL (ref 6–20)
CO2: 23 mmol/L (ref 22–32)
Calcium: 9 mg/dL (ref 8.9–10.3)
Chloride: 100 mmol/L (ref 98–111)
Creatinine, Ser: 0.99 mg/dL (ref 0.44–1.00)
GFR, Estimated: >60 mL/min (ref >60)
Glucose, Bld: 108 mg/dL — ABNORMAL HIGH (ref 70–99)
Potassium: 3.5 mmol/L (ref 3.5–5.1)
Sodium: 136 mmol/L (ref 135–145)

## 2022-09-30 LAB — HEPATIC FUNCTION PANEL
ALT: 11 U/L (ref 0–44)
AST: 30 U/L (ref 15–41)
Albumin: 4.2 g/dL (ref 3.5–5.0)
Alkaline Phosphatase: 58 U/L (ref 38–126)
Bilirubin, Direct: 0.1 mg/dL (ref 0.0–0.2)
Indirect Bilirubin: 0.6 mg/dL (ref 0.3–0.9)
Total Bilirubin: 0.7 mg/dL (ref 0.3–1.2)
Total Protein: 7.6 g/dL (ref 6.5–8.1)

## 2022-09-30 LAB — LACTIC ACID, PLASMA: Lactic Acid, Venous: 0.9 mmol/L (ref 0.5–1.9)

## 2022-09-30 LAB — RESP PANEL BY RT-PCR (RSV, FLU A&B, COVID)  RVPGX2
Influenza A by PCR: POSITIVE — AB
Influenza B by PCR: NEGATIVE
Resp Syncytial Virus by PCR: NEGATIVE
SARS Coronavirus 2 by RT PCR: NEGATIVE

## 2022-09-30 LAB — LIPASE, BLOOD: Lipase: 18 U/L (ref 11–51)

## 2022-09-30 LAB — PROTIME-INR
INR: 1.1 (ref 0.8–1.2)
Prothrombin Time: 14 seconds (ref 11.4–15.2)

## 2022-09-30 LAB — APTT: aPTT: 34 seconds (ref 24–36)

## 2022-09-30 LAB — PREGNANCY, URINE: Preg Test, Ur: NEGATIVE

## 2022-09-30 MED ORDER — LACTATED RINGERS IV SOLN: INTRAVENOUS | Status: DC

## 2022-09-30 MED ORDER — OSELTAMIVIR PHOSPHATE 75 MG PO CAPS: 75.0000 mg | ORAL_CAPSULE | Freq: Two times a day (BID) | ORAL | 0 refills | Status: AC

## 2022-09-30 MED ORDER — LACTATED RINGERS IV BOLUS
1000.0000 mL | Freq: Once | INTRAVENOUS | Status: AC
  Administered 2022-09-30: 1000 mL via INTRAVENOUS

## 2022-09-30 MED ORDER — KETOROLAC TROMETHAMINE 30 MG/ML IJ SOLN
15.0000 mg | Freq: Once | INTRAMUSCULAR | Status: AC
  Administered 2022-09-30: 15 mg via INTRAVENOUS
  Filled 2022-09-30: qty 1

## 2022-09-30 MED ORDER — LACTATED RINGERS IV BOLUS (SEPSIS)
1000.0000 mL | Freq: Once | INTRAVENOUS | Status: AC
  Administered 2022-09-30: 1000 mL via INTRAVENOUS

## 2022-09-30 NOTE — ED Triage Notes (Addendum)
Pt present to ED POV. Pt c/o lower back pain and fever since Wednesday. Reports temp of 103.5 orally at home.  Ibuprofen ~1300. Denies and GI/GU s/s. Reports mild cough   Pt adds later that she had had diarrhea

## 2022-09-30 NOTE — ED Provider Notes (Addendum)
[facility]ERGENC[facility] DEPT Provider Note   CSN: [id number] Arrival date & time: 09/30/22  1410     History  Chief Complaint  Patient presents with   Fever    Julie Ho is a 51 y.o. female.  Patient from home with a 3-day history of low back pain, body aches, chills, fever to 103.  Describes diffuse crampy low back pain, nausea, diarrhea but no vomiting.  Feels sore and achy all over with headache, cough and congestion as well.  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  Fever up to 103 at home taking Tylenol and Motrin.  No pain with urination or blood in the urine.  No chest pain or shortness of breath.  No abdominal pain.  Complains of headache, nasal congestion, sore throat but no difficulty breathing or difficulty swallowing. No regular medical Conditions.  No chronic medications.  The history is provided by the patient and the spouse.  Fever Associated symptoms: congestion, cough, diarrhea, headaches, myalgias, nausea, rhinorrhea and sore throat   Associated symptoms: no chest pain, no dysuria, no rash and no vomiting        Home Medications Prior to Admission medications   Medication Sig Start Date End Date Taking? Authorizing Provider  acetaminophen (TYLENOL) 325 MG tablet Take 650 mg by mouth every 6 (six) hours as needed.    [provider]  albuterol (PROVENTIL HFA;VENTOLIN HFA) 108 (90 Base) MCG/ACT inhaler Inhale 2 puffs into the lungs every 4 (four) hours as needed for wheezing or shortness of breath. 03/01/17   Janne Napoleon, NP  amoxicillin-clavulanate (AUGMENTIN) 875-125 MG tablet Take 1 tablet by mouth every 12 (twelve) hours. 12/23/18   Wieters, Hallie C, PA-C  Cetirizine HCl 10 MG CAPS Take 1 capsule (10 mg total) by mouth daily for 15 days. 03/08/18 03/23/18  Wieters, Hallie C, PA-C  fluticasone (FLONASE) 50 MCG/ACT nasal spray Place 1-2 sprays into both nostrils daily for 7 days. 12/23/18 12/30/18  Wieters, Hallie C, PA-C  ibuprofen  (ADVIL,MOTRIN) 600 MG tablet Take 1 tablet (600 mg total) by mouth every 6 (six) hours as needed. 03/08/18   Wieters, Hallie C, PA-C      Allergies    Aspirin    Review of Systems   Review of Systems  Constitutional:  Positive for activity change, appetite change, fatigue and fever.  HENT:  Positive for congestion, rhinorrhea and sore throat.   Eyes:  Negative for visual disturbance.  Respiratory:  Positive for cough.   Cardiovascular:  Negative for chest pain.  Gastrointestinal:  Positive for diarrhea and nausea. Negative for abdominal pain and vomiting.  Genitourinary:  Positive for flank pain. Negative for dysuria and hematuria.  Musculoskeletal:  Positive for arthralgias, back pain and myalgias.  Skin:  Negative for rash.  Neurological:  Positive for weakness and headaches.    all other systems are negative except as noted in the HPI and PMH.   Physical Exam Updated Vital Signs BP (!) 84/54 (BP Location: Right Arm)   Pulse 80   Temp 98.5 F (36.9 C)   Resp 16   SpO2 97%  Physical Exam Vitals and nursing note reviewed.  Constitutional:      General: She is not in acute distress.    Appearance: She is well-developed.     Comments: Ill appearing but nontoxic  HENT:     Head: Normocephalic and atraumatic.     Nose: Congestion present.     Mouth/Throat:  Pharynx: No oropharyngeal exudate.  Eyes:     Conjunctiva/sclera: Conjunctivae normal.     Pupils: Pupils are equal, round, and reactive to light.  Neck:     Comments: No meningismus. Cardiovascular:     Rate and Rhythm: Normal rate and regular rhythm.     Heart sounds: Normal heart sounds. No murmur heard. Pulmonary:     Effort: Pulmonary effort is normal. No respiratory distress.     Breath sounds: Normal breath sounds.  Chest:     Chest wall: No tenderness.  Abdominal:     Palpations: Abdomen is soft.     Tenderness: There is no abdominal tenderness. There is no guarding or rebound.  Musculoskeletal:         General: Tenderness present. Normal range of motion.     Cervical back: Normal range of motion and neck supple.     Comments: Paraspinal lumbar tenderness. No midline tenderness  Skin:    General: Skin is warm.  Neurological:     Mental Status: She is alert and oriented to person, place, and time.     Cranial Nerves: No cranial nerve deficit.     Motor: No abnormal muscle tone.     Coordination: Coordination normal.     Comments:  5/5 strength throughout. CN 2-12 intact.Equal grip strength.   Psychiatric:        Behavior: Behavior normal.     ED Results / Procedures / Treatments   Labs (all labs ordered are listed, but only abnormal results are displayed) Labs Reviewed  RESP PANEL BY RT-PCR (RSV, FLU A&B, COVID)  RVPGX2 - Abnormal; Notable for the following components:      Result Value   Influenza A by PCR POSITIVE (*)    All other components within normal limits  CBC WITH DIFFERENTIAL/PLATELET - Abnormal; Notable for the following components:   WBC 3.4 (*)    Lymphs Abs 0.6 (*)    All other components within normal limits  BASIC METABOLIC PANEL - Abnormal; Notable for the following components:   Glucose, Bld 108 (*)    All other components within normal limits  URINALYSIS, ROUTINE W REFLEX MICROSCOPIC - Abnormal; Notable for the following components:   APPearance HAZY (*)    Specific Gravity, Urine 1.034 (*)    Hgb urine dipstick SMALL (*)    Ketones, ur 40 (*)    Protein, ur 30 (*)    Bacteria, UA FEW (*)    All other components within normal limits  CULTURE, BLOOD (ROUTINE X 2)  CULTURE, BLOOD (ROUTINE X 2)  URINE CULTURE  HEPATIC FUNCTION PANEL  LIPASE, BLOOD  LACTIC ACID, PLASMA  PROTIME-INR  APTT  PREGNANCY, URINE  LACTIC ACID, PLASMA  TROPONIN I (HIGH SENSITIVITY)  TROPONIN I (HIGH SENSITIVITY)    EKG EKG Interpretation  Date/Time:  Friday September 30 2022 15:32:49 EST Ventricular Rate:  73 PR Interval:  133 QRS Duration: 100 QT Interval:  374 QTC  Calculation: 413 R Axis:   71 Text Interpretation: Sinus rhythm RSR' in V1 or V2, right VCD or RVH diffuse ST depressions No previous ECGs available Confirmed by Ezequiel Essex [phone]) on 09/30/2022 3:45:26 PM  Radiology CT Renal Stone Study  Result Date: 09/30/2022 CLINICAL DATA:  Abdominal and flank pain.  Stones suspected EXAM: CT ABDOMEN AND PELVIS WITHOUT CONTRAST TECHNIQUE: Multidetector CT imaging of the abdomen and pelvis was performed following the standard protocol without IV contrast. RADIATION DOSE REDUCTION: This exam was performed according to the departmental  dose-optimization program which includes automated exposure control, adjustment of the mA and/or kV according to patient size and/or use of iterative reconstruction technique. COMPARISON:  None Available. FINDINGS: Lower chest: No acute abnormality. Hepatobiliary: No suspicious focal liver abnormality is seen. Mild dilation of the common bile duct measuring 8 mm in diameter. No radiopaque stones. No evidence of cholecystitis. Pancreas: Unremarkable. No pancreatic ductal dilatation or surrounding inflammatory changes. Spleen: Normal in size without focal abnormality. Adrenals/Urinary Tract: Adrenal glands are unremarkable. Kidneys are normal, without renal calculi, suspicious focal lesion, or hydronephrosis. Bladder is unremarkable. Stomach/Bowel: Stomach is within normal limits. No evidence of bowel wall thickening, distention, or inflammatory changes. The appendix is normal. Vascular/Lymphatic: No significant vascular findings are present. No enlarged abdominal or pelvic lymph nodes. Reproductive: Unremarkable. Other: No free intraperitoneal fluid or air. Musculoskeletal: No acute or significant osseous findings. IMPRESSION: No acute abnormality in the abdomen or pelvis. Electronically Signed   By: Placido Sou M.D.   On: 09/30/2022 19:32   DG Chest Port 1 View  Result Date: 09/30/2022 CLINICAL DATA:  Fever.  Cough.  Sepsis.  EXAM: PORTABLE CHEST 1 VIEW COMPARISON:  None Available. FINDINGS: The heart size and mediastinal contours are within normal limits. Both lungs are clear. The visualized skeletal structures are unremarkable. IMPRESSION: No active disease. Electronically Signed   By: Marlaine Hind M.D.   On: 09/30/2022 15:45    Procedures Procedures    Medications Ordered in ED Medications  lactated ringers infusion (has no administration in time range)  lactated ringers bolus 1,000 mL (has no administration in time range)    ED Course/ Medical Decision Making/ A&P                           Medical Decision Making Amount and/or Complexity of Data Reviewed Labs: ordered. Decision-making details documented in ED Course. Radiology: ordered and independent interpretation performed. Decision-making details documented in ED Course. ECG/medicine tests: ordered and independent interpretation performed. Decision-making details documented in ED Course.  Risk Prescription drug management.   3 days of low back pain, body aches, chills, fever, cough and diarrhea.  Hypotensive on arrival but afebrile.  Ill-appearing but nontoxic.  Septic workup pursued.  Given IV fluids, blood cultures obtained, urinalysis, chest x-ray, COVID and flu swabs.  Blood pressure improving with IV fluids.  Flu swab is positive.  Chest x-ray negative for infiltrate.  Results reviewed interpreted by me.  Urinalysis with ketones without gross infection.  EKG does show some diffuse ST depressions without comparison.  She denies any chest pain.  Lactate is normal.  Does have mild neutropenia.  Creatinine is normal. CT scan was obtained to rule out infected kidney stone.  This is negative and shows no other acute pathology.  Results reviewed interpreted by me.  EKG discussed with Dr. Percival Spanish of cardiology who reviewed the tracings.  Feels there is baseline wander and no significant ST depression.  Does not recommend further workup.  Troponin  negative x 2 and patient denies any chest pain.  Blood pressure has improved to [id number] systolic.  Patient is tolerating p.o. and ambulatory.  Suspect supportive care for influenza A with antipyretics and p.o. fluids at home.  Cardiology not recommending any further follow-up for EKG.  Patient agreeable to Tamiflu after discussions of risk and benefits. Blood pressure has improved to 100s after IV fluids.  She feels well and not dizzy or lightheaded. Follow-up with PCP.  Return to the ED  sooner with difficulty breathing, chest pain, not able to eat or drink, intractable fevers or any other concerns.       Final Clinical Impression(s) / ED Diagnoses Final diagnoses:  Influenza  Abnormal EKG    Rx / DC Orders ED Discharge Orders     None         Olie Scaffidi, Jeannett Senior, MD 09/30/22 1954    Glynn Octave, MD 09/30/22 [phone]

## 2022-09-30 NOTE — ED Notes (Signed)
RN provided AVS using Teachback Method. Patient verbalizes understanding of Discharge Instructions. Opportunity for Questioning and Answers were provided by RN. Patient Discharged from ED ambulatory to Home with Family. ? ?

## 2022-09-30 NOTE — Progress Notes (Signed)
Elink following code sepsis °

## 2022-09-30 NOTE — Discharge Instructions (Addendum)
Your flu test is positive.  Keep yourself hydrated.  EKG has some nonspecific changes on it.  The cardiologist will call you to schedule an appointment for follow-up.  Use Tylenol or Motrin as needed for aches and for fevers.  Follow-up with your doctor.  Return to the ED with chest pain, shortness of breath, nausea, vomiting, ill able to eat or drink or other concerns.

## 2022-10-03 LAB — URINE CULTURE: Culture: 40000 — AB

## 2022-10-04 ENCOUNTER — Telehealth (HOSPITAL_BASED_OUTPATIENT_CLINIC_OR_DEPARTMENT_OTHER): Payer: Self-pay | Admitting: *Deleted

## 2022-10-04 NOTE — Telephone Encounter (Signed)
Post ED Visit - Positive Culture Follow-up  Culture report reviewed by antimicrobial stewardship pharmacist: [facility] []  , Pharm.D. []  Enzo Bi, Pharm.D., BCPS AQ-ID []  , Pharm.D., BCPS []  Celedonio Miyamoto, Pharm.D., BCPS []  [place], Garvin Fila.D., BCPS, AAHIVP []  , Pharm.D., BCPS, AAHIVP []  Georgina Pillion, PharmD, BCPS []  , PharmD, BCPS []  [place], PharmD, BCPS []  [address], PharmD []  , PharmD, BCPS []  Estella Husk, PharmD  Pharmacy Team []  Lysle Pearl, PharmD []  , PharmD []  Phillips Climes, PharmD []  , Rph []  Agapito Games) , PharmD []  Verlan Friends, PharmD []  , PharmD []  Mervyn Gay, PharmD []  , PharmD []  Vinnie Level, PharmD []  [facility], PharmD []  , PharmD []  Len Childs, PharmD   Positive urine culture Do not treat and no further patient follow-up is required at this time.  , Pharm D  Greer Pickerel Talley 10/04/2022, 10:10 AM

## 2022-10-05 LAB — CULTURE, BLOOD (ROUTINE X 2)
Culture: NO GROWTH
Culture: NO GROWTH
Special Requests: ADEQUATE
Special Requests: ADEQUATE

## 2022-10-17 IMAGING — MG MM DIGITAL DIAGNOSTIC UNILAT*L* W/ TOMO W/ CAD
4 series · 4 of 12 positions shown · non-contrast
Comparison: Previous exam(s).

CLINICAL DATA: 50-year-old female for further evaluation of
possible LEFT breast mass on screening mammogram.

EXAM:
DIGITAL DIAGNOSTIC UNILATERAL LEFT MAMMOGRAM WITH TOMOSYNTHESIS AND
CAD; ULTRASOUND LEFT BREAST LIMITED
TECHNIQUE: Left digital diagnostic mammography and breast tomosynthesis was
performed. The images were evaluated with computer-aided detection.;
Targeted ultrasound examination of the left breast was performed

[L MLO synth-2D]
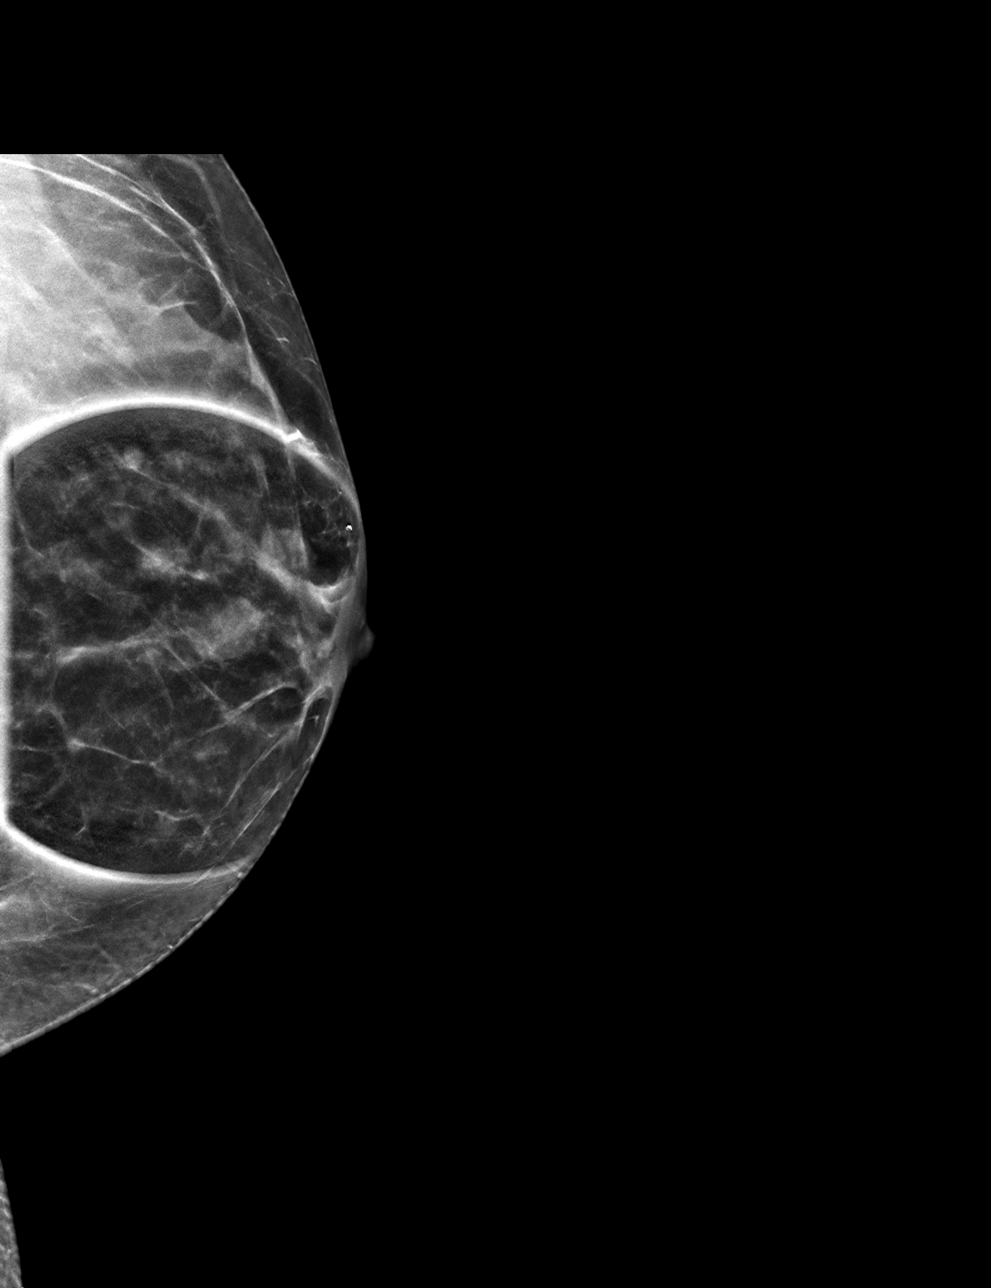

[L CC synth-2D]
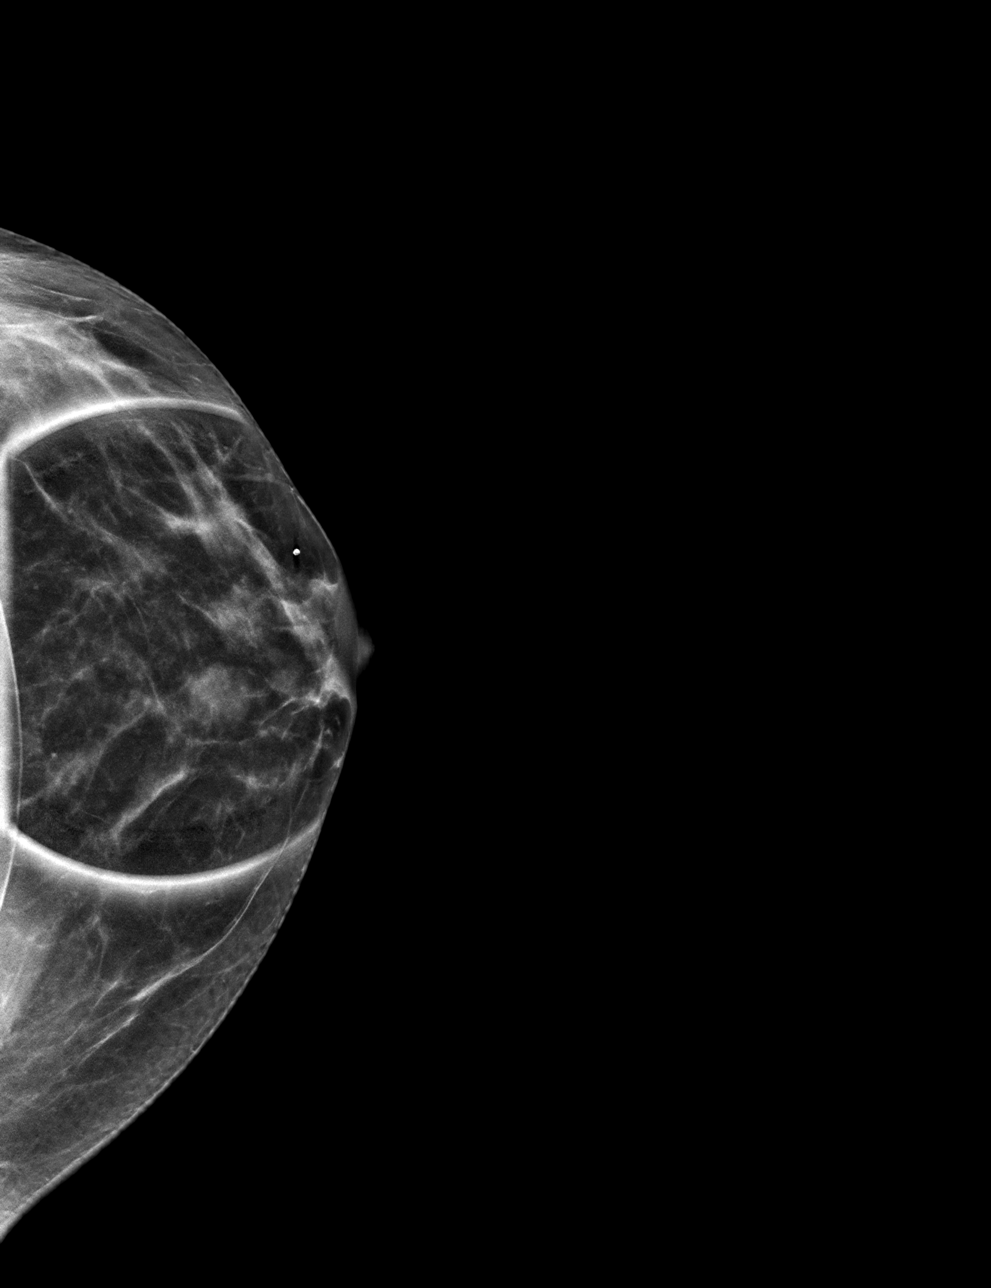

[L MLOID BREAST TOMOSYNTHESIS IMAGE tomo · tomo slice 25/50.0]
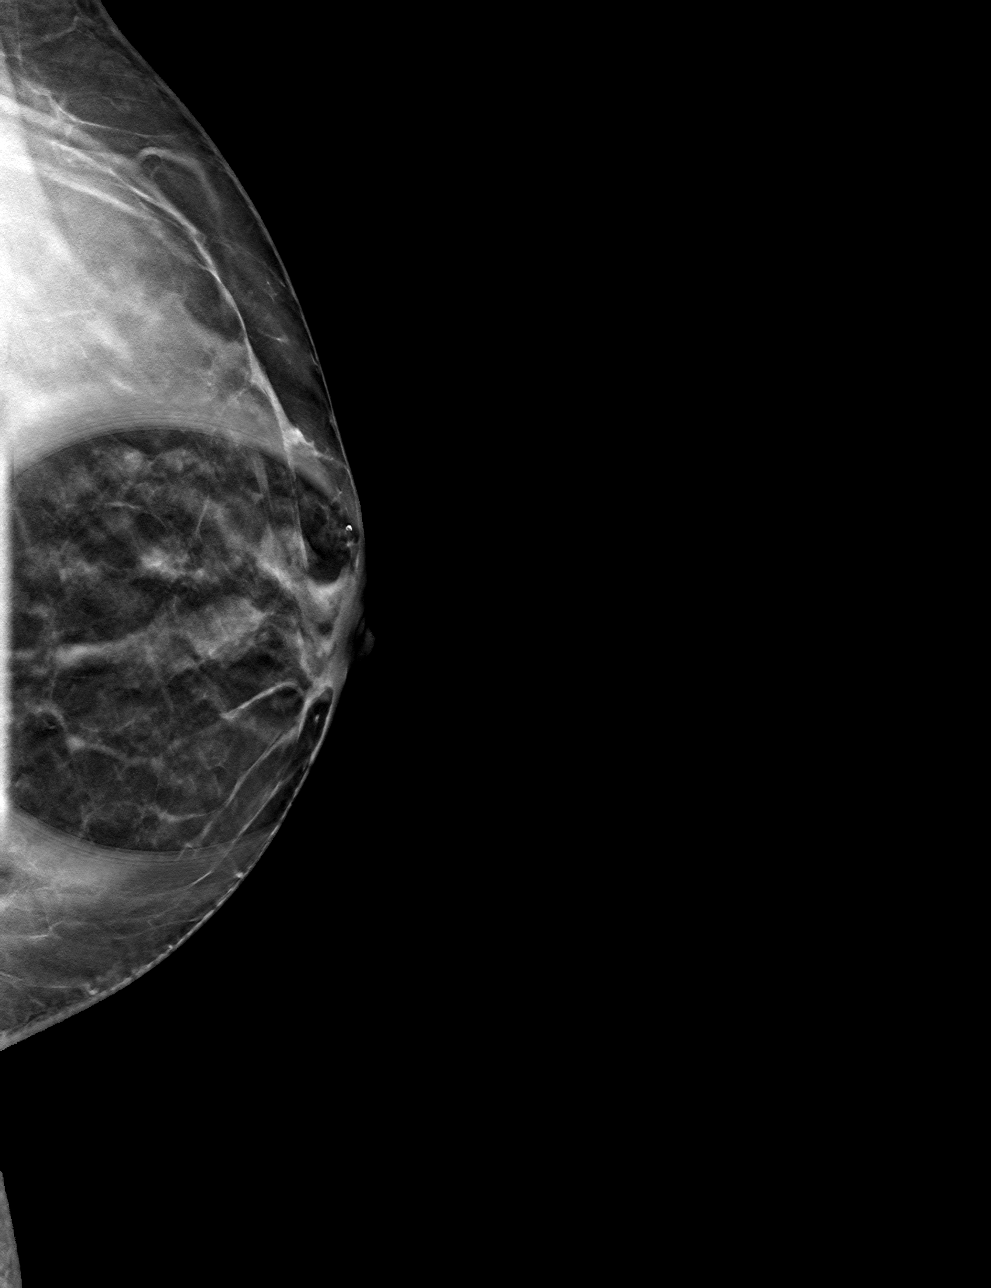

[L CCID BREAST TOMOSYNTHESIS IMAGE tomo · tomo slice 27/52.0]
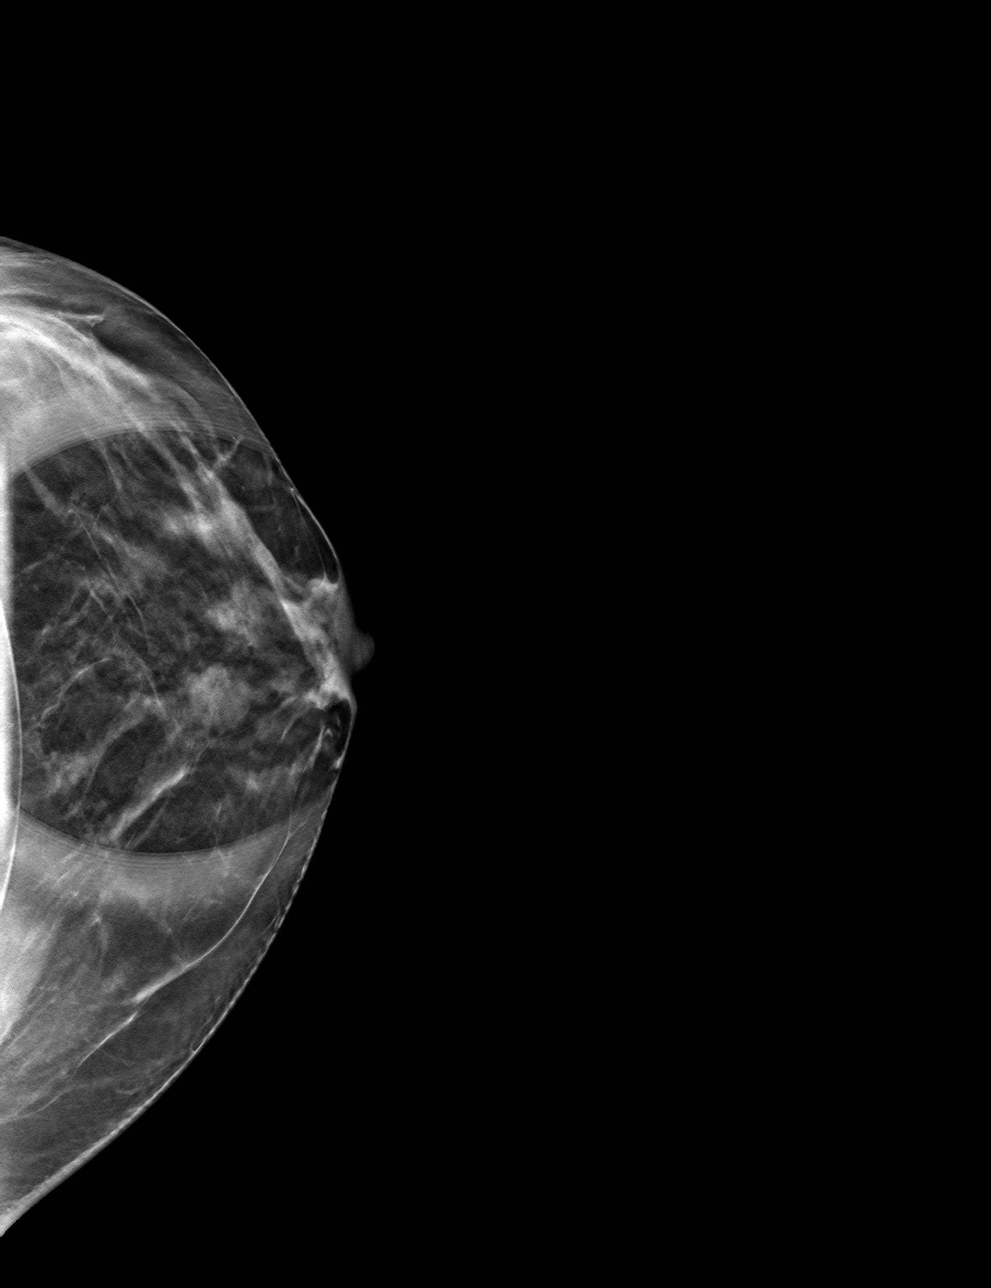

[4 of 12 positions shown; findings below may reference images not displayed]

ACR Breast Density Category c: The breast tissue is heterogeneously
dense, which may obscure small masses.
FINDINGS: 2D/3D spot compression views of the LEFT breast demonstrate a
persistent circumscribed oval mass in the RETROAREOLAR LEFT breast.

Targeted ultrasound is performed, showing a 1.1 x 0.6 x 1.1 cm
benign simple cyst at the 12 o'clock position of the RETROAREOLAR
LEFT breast, corresponding to the screening study finding.
IMPRESSION: Benign cyst in the RETROAREOLAR LEFT breast corresponding to the
screening study finding.

RECOMMENDATION:
Bilateral screening mammogram in 1 year.

I have discussed the findings and recommendations with the patient.
If applicable, a reminder letter will be sent to the patient
regarding the next appointment.

BI-RADS CATEGORY  2: Benign.

## 2022-10-17 IMAGING — US US BREAST*L* LIMITED INC AXILLA
1 series · 10 of 10 positions shown · non-contrast
Comparison: Previous exam(s).

CLINICAL DATA: 50-year-old female for further evaluation of
possible LEFT breast mass on screening mammogram.

EXAM:
DIGITAL DIAGNOSTIC UNILATERAL LEFT MAMMOGRAM WITH TOMOSYNTHESIS AND
CAD; ULTRASOUND LEFT BREAST LIMITED
TECHNIQUE: Left digital diagnostic mammography and breast tomosynthesis was
performed. The images were evaluated with computer-aided detection.;
Targeted ultrasound examination of the left breast was performed

[Series 1: us breast*left* limited inc axilla · 0.07mm/px · 10 of 10 slices shown]
[im 1/10]
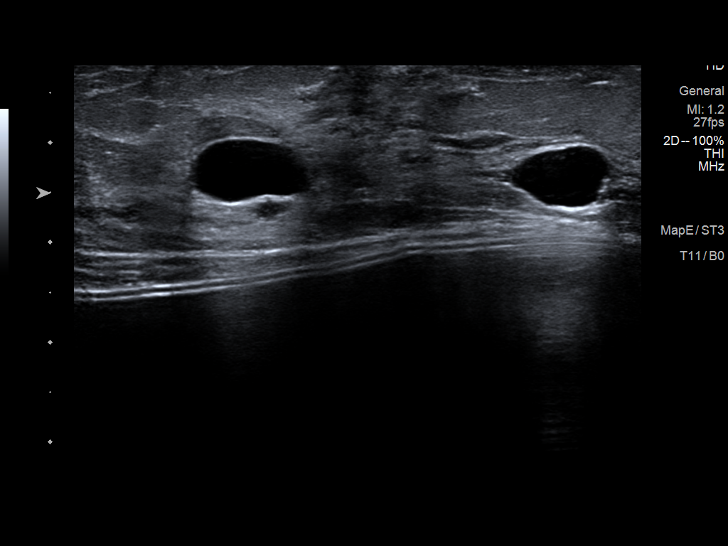
[im 2/10]
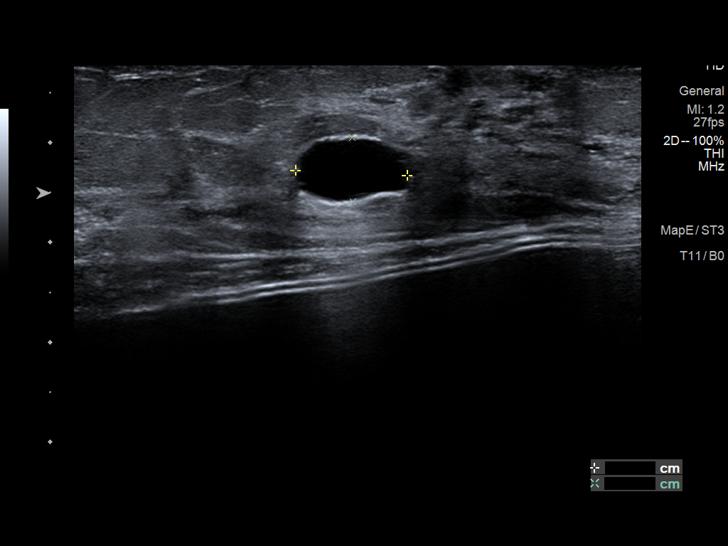
[im 3/10]
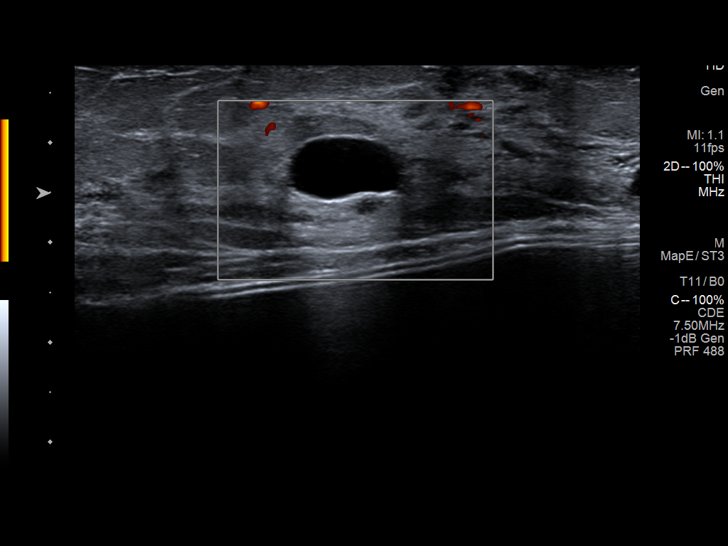
[im 4/10]
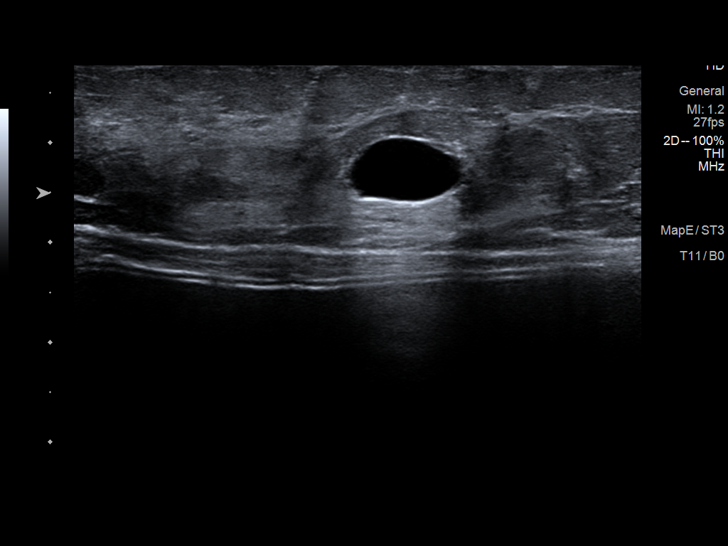
[im 5/10]
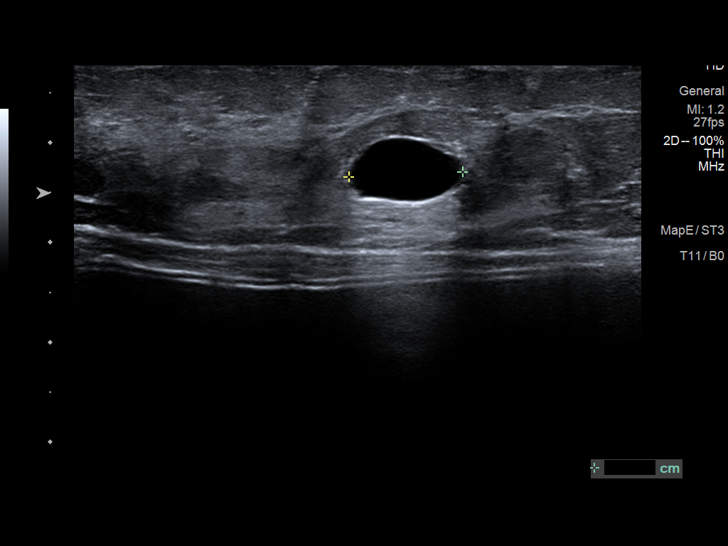
[im 6/10]
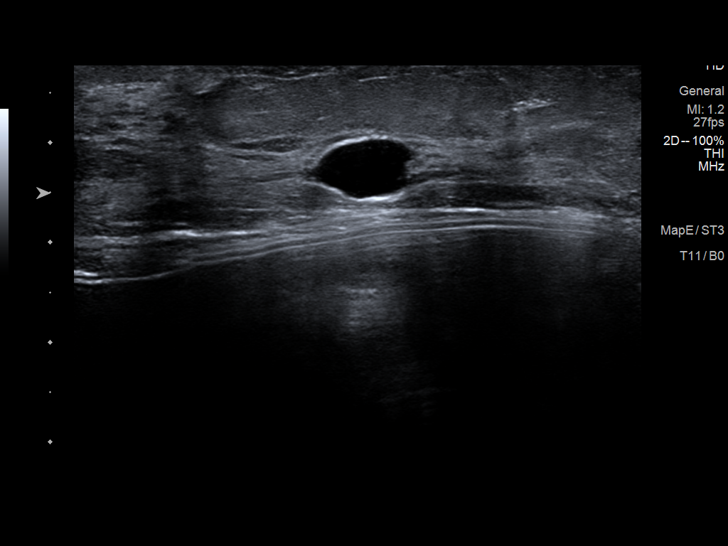
[im 7/10]
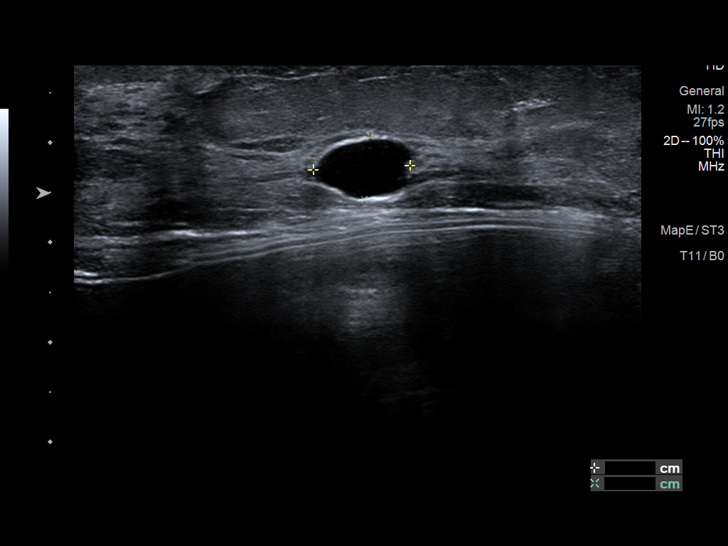
[im 8/10]
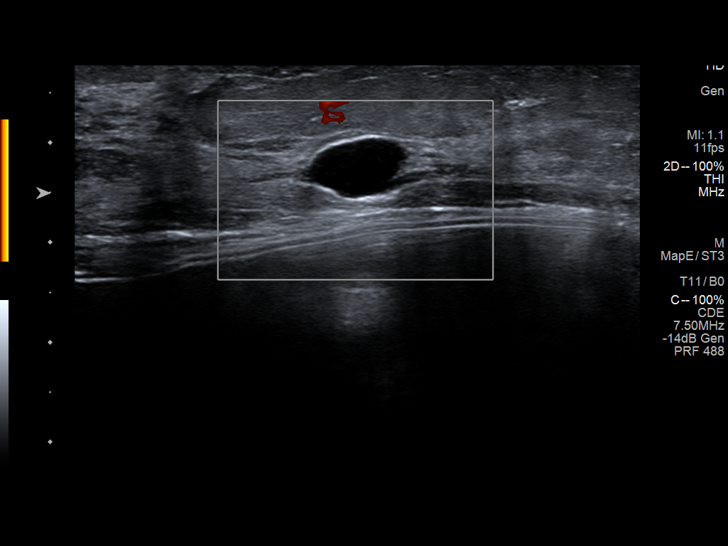
[im 9/10]
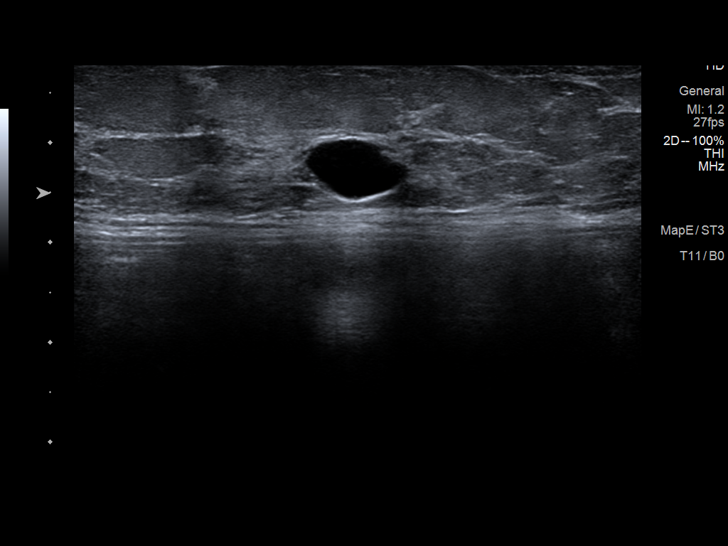
[im 10/10]
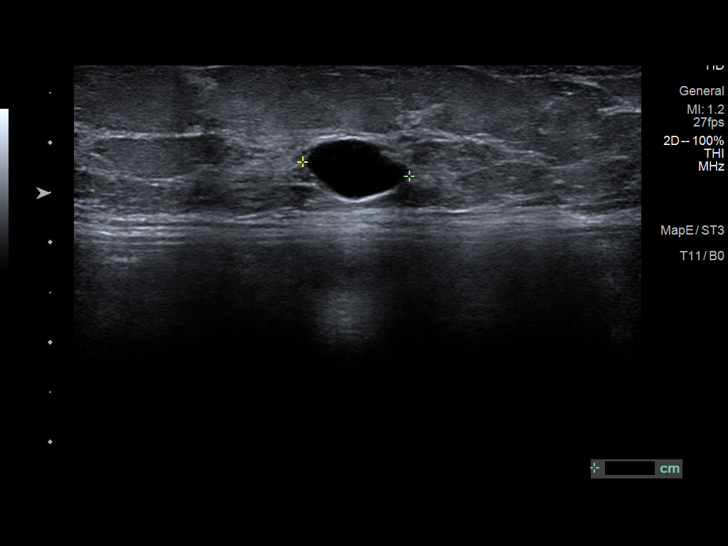

[10 of 10 positions shown; findings below may reference images not displayed]

ACR Breast Density Category c: The breast tissue is heterogeneously
dense, which may obscure small masses.
FINDINGS: 2D/3D spot compression views of the LEFT breast demonstrate a
persistent circumscribed oval mass in the RETROAREOLAR LEFT breast.

Targeted ultrasound is performed, showing a 1.1 x 0.6 x 1.1 cm
benign simple cyst at the 12 o'clock position of the RETROAREOLAR
LEFT breast, corresponding to the screening study finding.
IMPRESSION: Benign cyst in the RETROAREOLAR LEFT breast corresponding to the
screening study finding.

RECOMMENDATION:
Bilateral screening mammogram in 1 year.

I have discussed the findings and recommendations with the patient.
If applicable, a reminder letter will be sent to the patient
regarding the next appointment.

BI-RADS CATEGORY  2: Benign.
